# Patient Record
Sex: Male | Born: 1955 | Race: Black or African American | Hispanic: No | Marital: Married | Smoking: Never smoker
Health system: Southern US, Community
[De-identification: ages and names within clinical notes are randomized; demographics above are authoritative.]

---

## 2002-12-26 ENCOUNTER — Emergency Department (HOSPITAL_COMMUNITY): Admission: EM | Admit: 2002-12-26 | Discharge: 2002-12-26 | Payer: Self-pay | Admitting: Emergency Medicine

## 2002-12-26 ENCOUNTER — Encounter: Payer: Self-pay | Admitting: Emergency Medicine

## 2004-10-29 ENCOUNTER — Emergency Department (HOSPITAL_COMMUNITY): Admission: EM | Admit: 2004-10-29 | Discharge: 2004-10-29 | Payer: Self-pay | Admitting: Emergency Medicine

## 2011-12-16 ENCOUNTER — Emergency Department (HOSPITAL_COMMUNITY)
Admission: EM | Admit: 2011-12-16 | Discharge: 2011-12-17 | Disposition: A | Discharge status: Home / Self Care | Attending: Emergency Medicine | Admitting: Emergency Medicine

## 2011-12-16 ENCOUNTER — Encounter (HOSPITAL_COMMUNITY): Payer: Self-pay | Admitting: *Deleted

## 2011-12-16 DIAGNOSIS — Z23 Encounter for immunization: Secondary | ICD-10-CM | POA: Insufficient documentation

## 2011-12-16 DIAGNOSIS — L0390 Cellulitis, unspecified: Secondary | ICD-10-CM

## 2011-12-16 DIAGNOSIS — L02419 Cutaneous abscess of limb, unspecified: Secondary | ICD-10-CM | POA: Insufficient documentation

## 2011-12-16 NOTE — ED Notes (Signed)
Pt reports on 12/08/11 he hit his left medial ankle against the molding on the wall, pt states he sustained lac at that time, pt dressed wound and put hydrogen peroxide on it. Pt here today for eval of site increased swelling and redness to wound.

## 2011-12-17 MED ORDER — CLINDAMYCIN HCL 300 MG PO CAPS: 300.0000 mg | ORAL_CAPSULE | Freq: Three times a day (TID) | ORAL | Status: AC

## 2011-12-17 MED ORDER — TETANUS-DIPHTH-ACELL PERTUSSIS 5-2.5-18.5 LF-MCG/0.5 IM SUSP
0.5000 mL | Freq: Once | INTRAMUSCULAR | Status: AC
  Administered 2011-12-17: 0.5 mL via INTRAMUSCULAR
  Filled 2011-12-17: qty 0.5

## 2011-12-17 MED ORDER — CLINDAMYCIN HCL 300 MG PO CAPS
300.0000 mg | ORAL_CAPSULE | Freq: Once | ORAL | Status: AC
  Administered 2011-12-17: 300 mg via ORAL
  Filled 2011-12-17: qty 1

## 2011-12-17 MED ORDER — ONDANSETRON 4 MG PO TBDP: 4.0000 mg | ORAL_TABLET | Freq: Three times a day (TID) | ORAL | Status: AC | PRN

## 2011-12-17 NOTE — ED Provider Notes (Signed)
History     CSN: [id number]  Arrival date & time 12/16/11  2111   First MD Initiated Contact with Patient 12/17/11 0325      Chief Complaint  Patient presents with  . Wound Check    (Consider location/radiation/quality/duration/timing/severity/associated sxs/prior treatment) HPI  Patient presents to the emergency department requesting evaluation of left leg wound that happened one week ago. The patient states it is starting to become red around it and painful. He has also noticed that it is swollen. He denies the pain being severe. He denies fevers, chills, weakness, pain with walking. He states that he is not a diabetic and normally his wound heal well. He states that he injured his leg by accident when walking into some molding. She is afebrile pulse is 63.  History reviewed. No pertinent past medical history.  History reviewed. No pertinent past surgical history.  No family history on file.  History  Substance Use Topics  . Smoking status: Not on file  . Smokeless tobacco: Not on file  . Alcohol Use: Not on file      Review of Systems   HEENT: denies blurry vision or change in hearing PULMONARY: Denies difficulty breathing and SOB CARDIAC: denies chest pain or heart palpitations MUSCULOSKELETAL:  denies being unable to ambulate ABDOMEN AL: denies abdominal pain GU: denies loss of bowel or urinary control NEURO: denies numbness and tingling in extremities PSYCH: patient behavior is normal NECK: Not complaining of neck pain     Allergies  Review of patient's allergies indicates no known allergies.  Home Medications   Current Outpatient Rx  Name Route Sig Dispense Refill  . IBUPROFEN 200 MG PO TABS Oral Take 200 mg by mouth every 6 (six) hours as needed. Pain    . CLINDAMYCIN HCL 300 MG PO CAPS Oral Take 1 capsule (300 mg total) by mouth 3 (three) times daily. 21 capsule 0  . ONDANSETRON 4 MG PO TBDP Oral Take 1 tablet (4 mg total) by mouth every 8 (eight)  hours as needed for nausea. 20 tablet 0    BP 158/85  Pulse 63  Temp 97.9 F (36.6 C) (Oral)  Resp 16  Ht 5\' 10"  (1.778 m)  Wt 220 lb (99.791 kg)  BMI 31.57 kg/m2  SpO2 100%  Physical Exam  Nursing note and vitals reviewed. Constitutional: He appears well-developed and well-nourished. No distress.  HENT:  Head: Normocephalic and atraumatic.  Eyes: Pupils are equal, round, and reactive to light.  Neck: Normal range of motion. Neck supple.  Cardiovascular: Normal rate and regular rhythm.   Pulmonary/Chest: Effort normal.  Abdominal: Soft.  Neurological: He is alert.  Skin: Skin is warm and dry.          Small  4.5cm in diameter cellulitis surrounding already scabbed over 4 cm laceration to the left shin. No signs of crepitus no pain with passive or active range of motion. No streaking, fluctuance or abscessing.    ED Course  Procedures (including critical care time)  Labs Reviewed - No data to display No results found.   1. Cellulitis       MDM  She was given first dose of clindamycin while in the ED. Afrin a prescription for clindamycin and Zofran. I have requested the patient return in 48 hours for wound recheck.  Pt has been advised of the symptoms that warrant their return to the ED. Patient has voiced understanding and has agreed to follow-up with the PCP or specialist.  Dorthula Matas, PA 12/17/11 [phone]

## 2011-12-17 NOTE — ED Provider Notes (Signed)
Medical screening examination/treatment/procedure(s) were performed by non-physician practitioner and as supervising physician I was immediately available for consultation/collaboration.   Hanley Seamen, MD 12/17/11 [phone]

## 2011-12-17 NOTE — Discharge Instructions (Signed)

## 2015-07-10 ENCOUNTER — Emergency Department (HOSPITAL_COMMUNITY)

## 2015-07-10 ENCOUNTER — Encounter (HOSPITAL_COMMUNITY): Payer: Self-pay

## 2015-07-10 ENCOUNTER — Emergency Department (HOSPITAL_COMMUNITY)
Admission: EM | Admit: 2015-07-10 | Discharge: 2015-07-10 | Disposition: A | Discharge status: Home / Self Care | Attending: Emergency Medicine | Admitting: Emergency Medicine

## 2015-07-10 DIAGNOSIS — S40012A Contusion of left shoulder, initial encounter: Secondary | ICD-10-CM

## 2015-07-10 DIAGNOSIS — Y9289 Other specified places as the place of occurrence of the external cause: Secondary | ICD-10-CM | POA: Diagnosis not present

## 2015-07-10 DIAGNOSIS — S4992XA Unspecified injury of left shoulder and upper arm, initial encounter: Secondary | ICD-10-CM | POA: Diagnosis present

## 2015-07-10 DIAGNOSIS — W000XXA Fall on same level due to ice and snow, initial encounter: Secondary | ICD-10-CM | POA: Insufficient documentation

## 2015-07-10 DIAGNOSIS — Y9389 Activity, other specified: Secondary | ICD-10-CM | POA: Insufficient documentation

## 2015-07-10 DIAGNOSIS — M25512 Pain in left shoulder: Secondary | ICD-10-CM

## 2015-07-10 DIAGNOSIS — W009XXA Unspecified fall due to ice and snow, initial encounter: Secondary | ICD-10-CM

## 2015-07-10 DIAGNOSIS — Y998 Other external cause status: Secondary | ICD-10-CM | POA: Diagnosis not present

## 2015-07-10 MED ORDER — DIAZEPAM 5 MG PO TABS: 5.0000 mg | ORAL_TABLET | Freq: Two times a day (BID) | ORAL | Status: AC | PRN

## 2015-07-10 MED ORDER — OXYCODONE-ACETAMINOPHEN 5-325 MG PO TABS: 1.0000 | ORAL_TABLET | Freq: Four times a day (QID) | ORAL | Status: AC | PRN

## 2015-07-10 MED ORDER — OXYCODONE-ACETAMINOPHEN 5-325 MG PO TABS
1.0000 | ORAL_TABLET | Freq: Once | ORAL | Status: AC
  Administered 2015-07-10: 1 via ORAL
  Filled 2015-07-10: qty 1

## 2015-07-10 MED ORDER — NAPROXEN 500 MG PO TABS
500.0000 mg | ORAL_TABLET | Freq: Once | ORAL | Status: AC
  Administered 2015-07-10: 500 mg via ORAL
  Filled 2015-07-10: qty 1

## 2015-07-10 MED ORDER — NAPROXEN 500 MG PO TABS: 500.0000 mg | ORAL_TABLET | Freq: Two times a day (BID) | ORAL | Status: AC

## 2015-07-10 NOTE — Discharge Instructions (Signed)
Ice areas of pain 3-4 times per day for 15-20 minutes each time. We also recommend that you stretch your shoulder 3-4 times per day to prevent stiffening. Take naproxen as prescribed for pain control and Percocet as needed for severe pain. You may take Valium as prescribed for muscle spasms of your left shoulder. Follow-up with an orthopedist for further evaluation of your injury, especially if pain persists.  Shoulder Pain The shoulder is the joint that connects your arms to your body. The bones that form the shoulder joint include the upper arm bone (humerus), the shoulder blade (scapula), and the collarbone (clavicle). The top of the humerus is shaped like a ball and fits into a rather flat socket on the scapula (glenoid cavity). A combination of muscles and strong, fibrous tissues that connect muscles to bones (tendons) support your shoulder joint and hold the ball in the socket. Small, fluid-filled sacs (bursae) are located in different areas of the joint. They act as cushions between the bones and the overlying soft tissues and help reduce friction between the gliding tendons and the bone as you move your arm. Your shoulder joint allows a wide range of motion in your arm. This range of motion allows you to do things like scratch your back or throw a ball. However, this range of motion also makes your shoulder more prone to pain from overuse and injury. Causes of shoulder pain can originate from both injury and overuse and usually can be grouped in the following four categories:  Redness, swelling, and pain (inflammation) of the tendon (tendinitis) or the bursae (bursitis).  Instability, such as a dislocation of the joint.  Inflammation of the joint (arthritis).  Broken bone (fracture). HOME CARE INSTRUCTIONS   Apply ice to the sore area.  Put ice in a plastic bag.  Place a towel between your skin and the bag.  Leave the ice on for 15-20 minutes, 3-4 times per day for the first 2 days, or  as directed by your health care provider.  Stop using cold packs if they do not help with the pain.  If you have a shoulder sling or immobilizer, wear it as long as your caregiver instructs. Only remove it to shower or bathe. Move your arm as little as possible, but keep your hand moving to prevent swelling.  Squeeze a soft ball or foam pad as much as possible to help prevent swelling.  Only take over-the-counter or prescription medicines for pain, discomfort, or fever as directed by your caregiver. SEEK MEDICAL CARE IF:   Your shoulder pain increases, or new pain develops in your arm, hand, or fingers.  Your hand or fingers become cold and numb.  Your pain is not relieved with medicines. SEEK IMMEDIATE MEDICAL CARE IF:   Your arm, hand, or fingers are numb or tingling.  Your arm, hand, or fingers are significantly swollen or turn white or blue. MAKE SURE YOU:   Understand these instructions.  Will watch your condition.  Will get help right away if you are not doing well or get worse.   This information is not intended to replace advice given to you by your health care provider. Make sure you discuss any questions you have with your health care provider.   Document Released: 03/27/2005 Document Revised: 07/08/2014 Document Reviewed: 10/10/2014 Elsevier Interactive Patient Education [employer]2016 [employer] Inc.

## 2015-07-10 NOTE — ED Notes (Signed)
Pt slipped on ice and fell on his left shoulder, he's unable to lift his shoulder all the way up

## 2015-07-10 NOTE — ED Provider Notes (Signed)
CSN: [id number]     Arrival date & time 07/10/15  2001 History  By signing my name below, I, Raymond Kramer, attest that this documentation has been prepared under the direction and in the presence of non-physician practitioner, Antony Madura, PA-C. Electronically Signed: Freida Kramer, Scribe. 07/10/2015. 9:10 PM.    Chief Complaint  Patient presents with  . Shoulder Injury     The history is provided by the patient. No language interpreter was used.     HPI Comments:  Raymond Kramer is a 60 y.o. male who presents to the Emergency Department complaining of 8/10 sharp left upper arm and shoulder pain s/p  fall ~1730 today. Pt states he slipped on ice and landed on his left upper arm. He notes his pain is worse with movement. No alleviating factors noted. No medications taken PTA. No reported head trauma or LOC. He denies paresthesias and numbness in the extremity. Pt is right hand dominant.   WUJ:[id number] physicians palladium   History reviewed. No pertinent past medical history. History reviewed. No pertinent past surgical history. History reviewed. No pertinent family history. Social History  Substance Use Topics  . Smoking status: Never Smoker   . Smokeless tobacco: None  . Alcohol Use: No    Review of Systems  Constitutional: Negative for fever and chills.  Respiratory: Negative for shortness of breath.   Cardiovascular: Negative for chest pain.  Musculoskeletal: Positive for myalgias and arthralgias.       LUE    Allergies  Review of patient's allergies indicates no known allergies.  Home Medications   Prior to Admission medications   Medication Sig Start Date End Date Taking? Authorizing Provider  diazepam (VALIUM) 5 MG tablet Take 1 tablet (5 mg total) by mouth every 12 (twelve) hours as needed for muscle spasms. 07/10/15   Antony Madura, PA-C  ibuprofen (ADVIL,MOTRIN) 200 MG tablet Take 200 mg by mouth every 6 (six) hours as needed. Pain    Historical Provider, MD   naproxen (NAPROSYN) 500 MG tablet Take 1 tablet (500 mg total) by mouth 2 (two) times daily. 07/10/15   Antony Madura, PA-C  oxyCODONE-acetaminophen (PERCOCET/ROXICET) 5-325 MG tablet Take 1-2 tablets by mouth every 6 (six) hours as needed for severe pain. 07/10/15   Antony Madura, PA-C   BP 165/92 mmHg  Pulse 98  Temp(Src) 98.4 F (36.9 C) (Oral)  Resp 16  Ht 5\' 10"  (1.778 m)  Wt 91.173 kg  BMI 28.84 kg/m2  SpO2 100%   Physical Exam  Constitutional: He is oriented to person, place, and time. He appears well-developed and well-nourished. No distress.  Nontoxic/nonseptic appearing  HENT:  Head: Normocephalic and atraumatic.  Eyes: Conjunctivae and EOM are normal. No scleral icterus.  Neck: Normal range of motion.  Cardiovascular: Normal rate, regular rhythm and intact distal pulses.   Distal radial pulse 2+ in the LUE  Pulmonary/Chest: Effort normal. No respiratory distress. He has no wheezes.  Respirations even and unlabored.  Musculoskeletal:       Left shoulder: He exhibits decreased range of motion, tenderness and pain. He exhibits no swelling, no crepitus, no deformity, no spasm and normal strength.       Cervical back: Normal.       Left upper arm: Normal.  Preserved AROM of L shoulder with abduction; limited flexion of L shoulder due to pain. No bony deformity or crepitus. No effusion, erythema, or heat to touch. Negative sulcus sign.  Neurological: He is alert and oriented to person,  place, and time. He exhibits normal muscle tone. Coordination normal.  Grip strength 5/5 in the LUE. Strength in the LUE intact.   Skin: Skin is warm and dry. No rash noted. He is not diaphoretic. No erythema. No pallor.  Psychiatric: He has a normal mood and affect. His behavior is normal.  Nursing note and vitals reviewed.   ED Course  Procedures   DIAGNOSTIC STUDIES:  Oxygen Saturation is 95% on RA, normal by my interpretation.    COORDINATION OF CARE:  9:09 PM Advised pt to exercise the  shoulder as much as possible. Discussed treatment plan with pt at bedside and pt agreed to plan.  Imaging Review Dg Shoulder Left  07/10/2015  CLINICAL DATA:  Pain following fall earlier today EXAM: LEFT SHOULDER - 2+ VIEW COMPARISON:  None. FINDINGS: Oblique, Y scapular, and axillary images were obtained. There is cortical irregularity along the lateral humeral head with what appears to be a Hill-Sachs type defect of uncertain age. No other evidence suggesting fracture. No dislocation. 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 No erosive change. IMPRESSION: Cortical irregularity along the lateral humeral head, best seen on the axillary view. This appearance is consistent with an age uncertain Hill-Sachs defect. The appearance in this area raises concern for recent defect in this area. No other findings suggesting fracture. Currently no dislocation. Question with the patient had anterior dislocation with spontaneous reduction at the time of injury. Potentially CT with particular attention to the lateral humeral head could be helpful to further evaluate. 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 Electronically Signed   By: Bretta BangWilliam  Woodruff III M.D.   On: 07/10/2015 21:12   I have personally reviewed and evaluated these images as part of my medical decision-making.   MDM   Final diagnoses:  Contusion of left shoulder, initial encounter  Left shoulder pain  Fall from slipping on ice, initial encounter    60 year old male presents to the emergency department for evaluation of left shoulder pain after a slip and fall on ice. Patient denies head trauma or loss of consciousness. He is neurovascularly intact. Tenderness noted to the left shoulder without bony deformity or crepitus. X-ray obtained which shows a cortical irregularity without evidence of fracture or dislocation. Story not consistent with dislocation and spontaneous reduction. No concern for septic  joint. Patient given shoulder sling. Will refer to orthopedics. Have advised icing, stretching, and NSAIDs. Patient also given Percocet and Valium for additional pain control. Return precautions discussed and provided. Patient discharged in good condition with no unaddressed concerns.  I personally performed the services described in this documentation, which was scribed in my presence. The recorded information has been reviewed and is accurate.    Filed Vitals:   07/10/15 2026 07/10/15 2156  BP: 166/107 165/92  Pulse: 72 98  Temp: 98.4 F (36.9 C)   TempSrc: Oral   Resp: 20 16  Height: 5\' 10"  (1.778 m)   Weight: 91.173 kg   SpO2: 95% 100%      Antony MaduraKelly Lorna Strother, PA-C 07/10/15 2202  Antony MaduraKelly Tiwan Schnitker, PA-C 07/10/15 2203  Loren Raceravid Yelverton, MD 07/11/15 [phone]

## 2016-04-08 IMAGING — CR DG SHOULDER 2+V*L*
3 series · 3 of 3 positions shown · non-contrast
Comparison: None.

CLINICAL DATA: Pain following fall earlier today

EXAM:
LEFT SHOULDER - 2+ VIEW

[w shoulder external left]
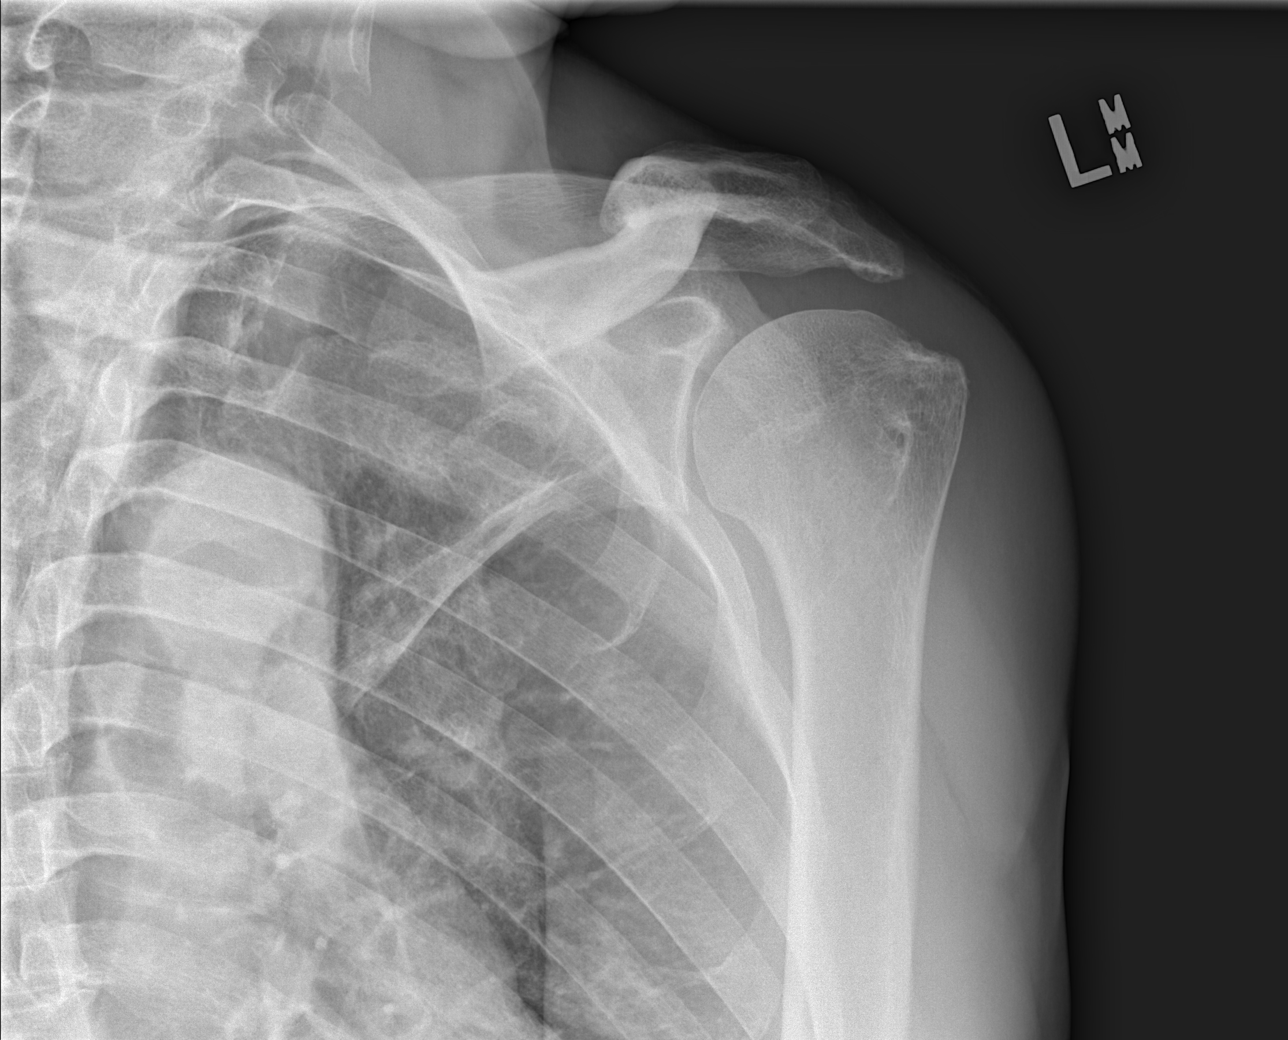

[w shoulder y-view left]
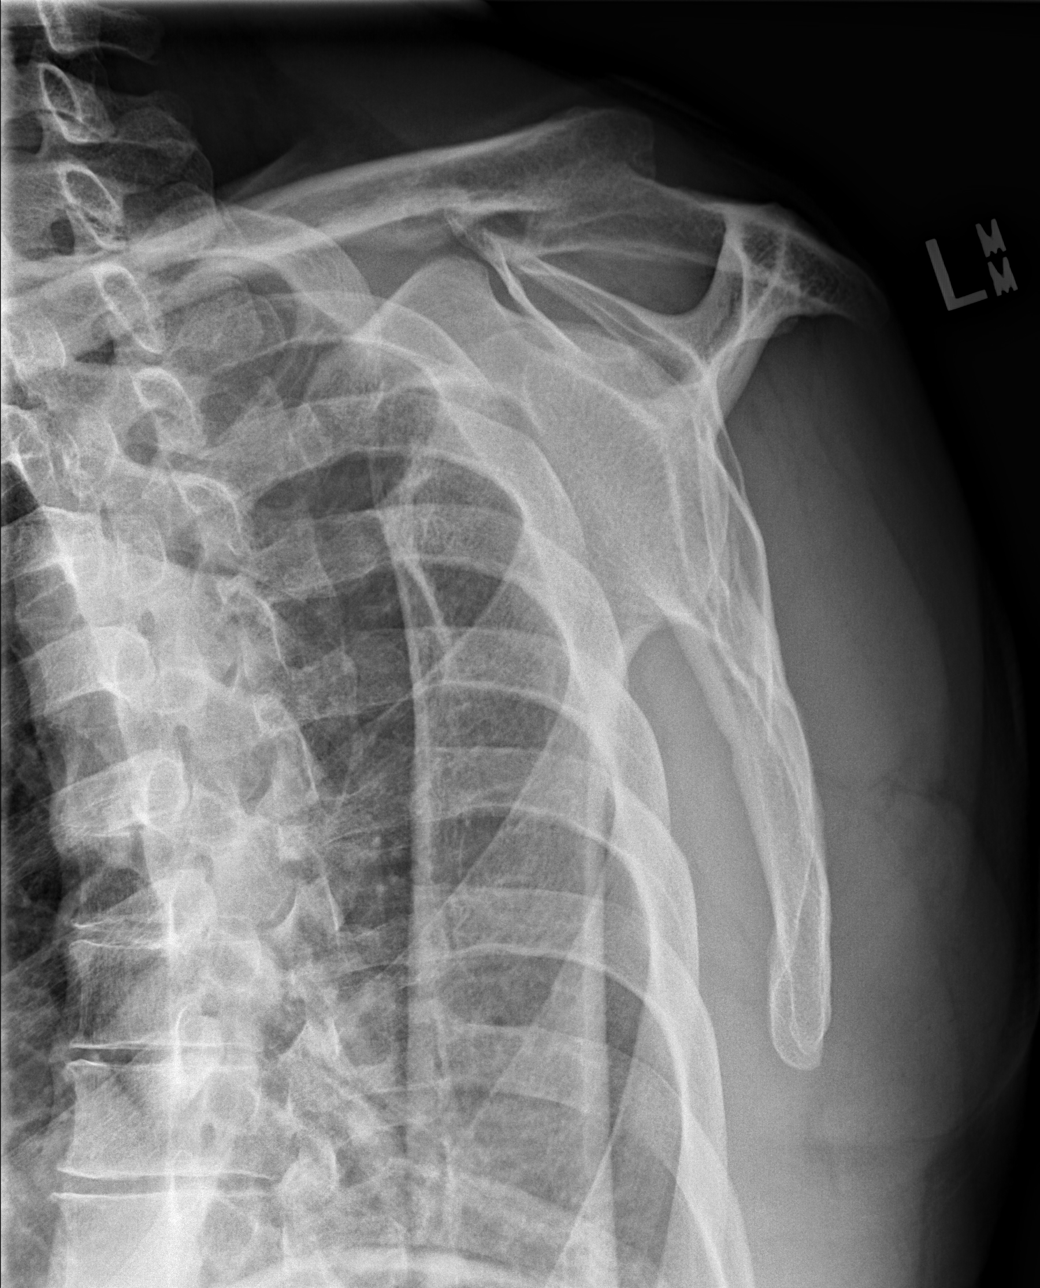

[x shoulder axillary left]
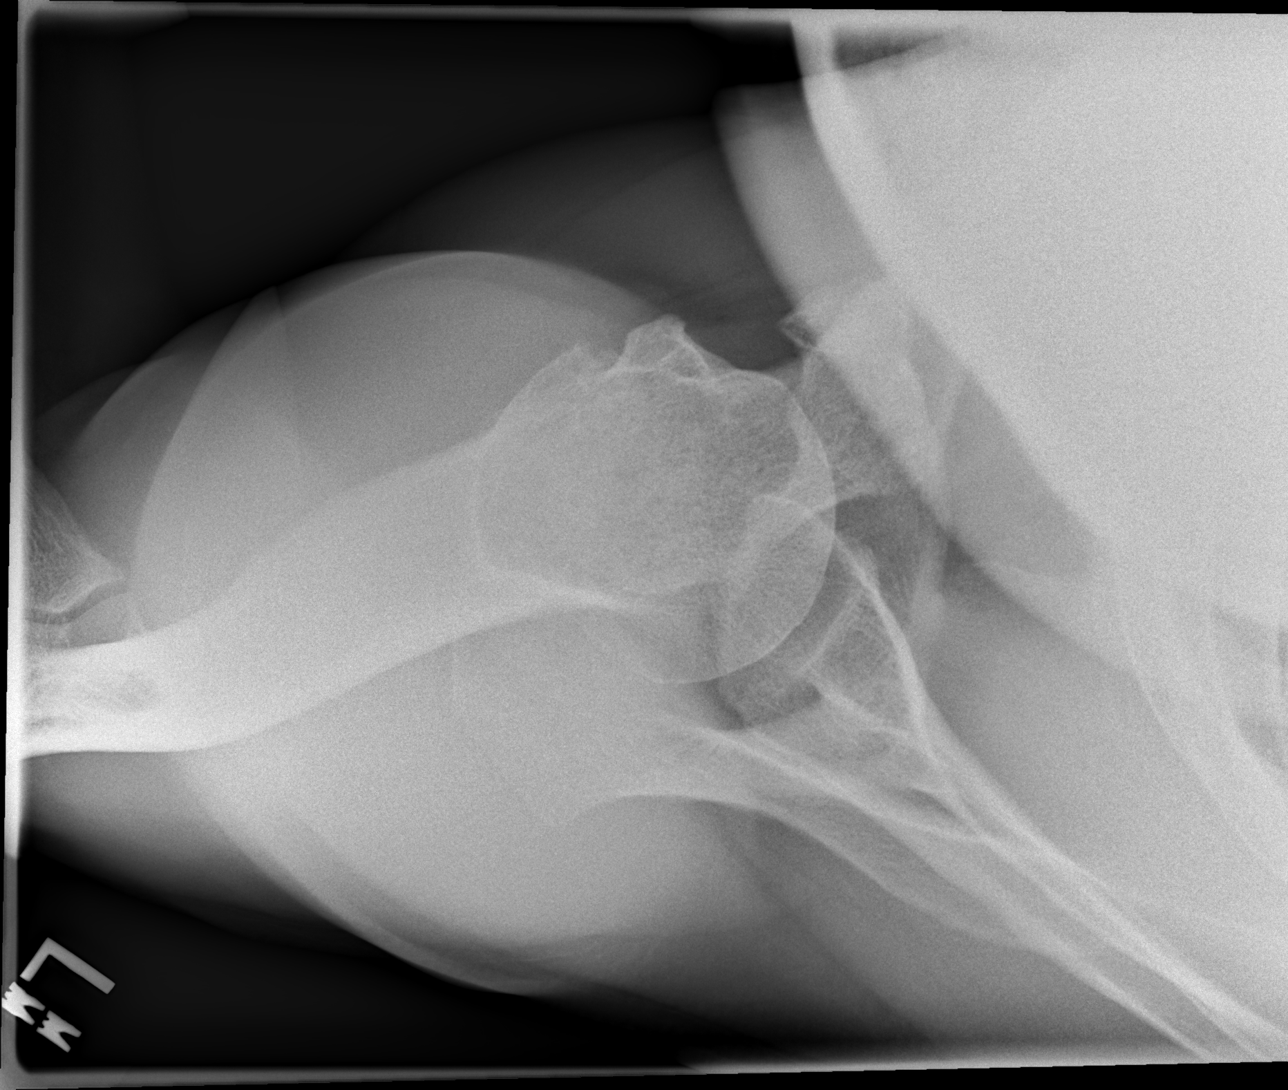

[3 of 3 positions shown; findings below may reference images not displayed]

FINDINGS: Oblique, Y scapular, and axillary images were obtained. There is
cortical irregularity along the lateral humeral head with what
appears to be a Hill-Sachs type defect of uncertain age. No other
evidence suggesting fracture. No dislocation. There is
osteoarthritic change in the acromioclavicular joint. No erosive
change.
IMPRESSION: Cortical irregularity along the lateral humeral head, best seen on
the axillary view. This appearance is consistent with an age
uncertain Hill-Sachs defect. The appearance in this area raises
concern for recent defect in this area. No other findings suggesting
fracture. Currently no dislocation. Question with the patient had
anterior dislocation with spontaneous reduction at the time of
injury. Potentially CT with particular attention to the lateral
humeral head could be helpful to further evaluate. There is
osteoarthritic change in the acromioclavicular joint.

## 2018-05-30 ENCOUNTER — Other Ambulatory Visit: Payer: Self-pay

## 2018-05-30 ENCOUNTER — Emergency Department (HOSPITAL_COMMUNITY)
Admission: EM | Admit: 2018-05-30 | Discharge: 2018-05-30 | Disposition: A | Discharge status: Home / Self Care | Attending: Emergency Medicine | Admitting: Emergency Medicine

## 2018-05-30 DIAGNOSIS — Z79899 Other long term (current) drug therapy: Secondary | ICD-10-CM | POA: Insufficient documentation

## 2018-05-30 DIAGNOSIS — L03113 Cellulitis of right upper limb: Secondary | ICD-10-CM | POA: Diagnosis present

## 2018-05-30 DIAGNOSIS — L0291 Cutaneous abscess, unspecified: Secondary | ICD-10-CM

## 2018-05-30 MED ORDER — LIDOCAINE HCL (PF) 1 % IJ SOLN
10.0000 mL | Freq: Once | INTRAMUSCULAR | Status: AC
  Administered 2018-05-30: 10 mL via INTRADERMAL
  Filled 2018-05-30: qty 10

## 2018-05-30 MED ORDER — CEPHALEXIN 500 MG PO CAPS: 500.0000 mg | ORAL_CAPSULE | Freq: Four times a day (QID) | ORAL | 0 refills | Status: AC

## 2018-05-30 NOTE — ED Triage Notes (Signed)
Significant swelling noted to right arm, obvious insect bite and abscess noted. Swelling radiates throughout the arm. Pt states he thinks a spider bit him.

## 2018-05-30 NOTE — ED Provider Notes (Signed)
[facility]   CSN: [id number] Arrival date & time: 05/30/18  [id number]     History   Chief Complaint Chief Complaint  Patient presents with  . Arm Swelling    right  . Insect Bite    HPI Raymond Kramer is a 62 y.o. male who presents for evaluation of RUE pain and swelling that has been ongoing for the last 2 days. He states that he thinks an insect may have bitten him but he does not recall the specific event. He noticed a small raised bump that appeared with swelling around it. No overlying warmth or erythema. He denies trauma, injury to the arm. He has not noted any fevers, numbness/weakness.   The history is provided by the patient.    No past medical history on file.  There are no active problems to display for this patient.   No past surgical history on file.      Home Medications    Prior to Admission medications   Medication Sig Start Date End Date Taking? Authorizing Provider  cephALEXin (KEFLEX) 500 MG capsule Take 1 capsule (500 mg total) by mouth 4 (four) times daily for 7 days. 05/30/18 06/06/18  Maxwell CaulLayden, Lindsey A, PA-C  diazepam (VALIUM) 5 MG tablet Take 1 tablet (5 mg total) by mouth every 12 (twelve) hours as needed for muscle spasms. 07/10/15   Antony MaduraHumes, Kelly, PA-C  ibuprofen (ADVIL,MOTRIN) 200 MG tablet Take 200 mg by mouth every 6 (six) hours as needed. Pain    [provider]  naproxen (NAPROSYN) 500 MG tablet Take 1 tablet (500 mg total) by mouth 2 (two) times daily. 07/10/15   Antony MaduraHumes, Kelly, PA-C  oxyCODONE-acetaminophen (PERCOCET/ROXICET) 5-325 MG tablet Take 1-2 tablets by mouth every 6 (six) hours as needed for severe pain. 07/10/15   Antony MaduraHumes, Kelly, PA-C    Family History No family history on file.  Social History Social History   Tobacco Use  . Smoking status: Never Smoker  Substance Use Topics  . Alcohol use: No  . Drug use: Not on file     Allergies   Patient has no known  allergies.   Review of Systems Review of Systems  Constitutional: Negative for fever.  Skin: Positive for wound. Negative for color change.  Neurological: Negative for weakness and numbness.  All other systems reviewed and are negative.    Physical Exam Updated Vital Signs BP (!) 172/122 (BP Location: Left Arm)   Pulse 74   Temp 97.7 F (36.5 C) (Oral)   Resp 16   SpO2 100%   Physical Exam  Constitutional: He appears well-developed and well-nourished.  HENT:  Head: Normocephalic and atraumatic.  Eyes: Conjunctivae and EOM are normal. Right eye exhibits no discharge. Left eye exhibits no discharge. No scleral icterus.  Cardiovascular:  Pulses:      Radial pulses are 2+ on the right side, and 2+ on the left side.  Pulmonary/Chest: Effort normal.  Musculoskeletal:  No bony tenderness to the right elbow. No overlying warmth, erythema. Full flexion/extension of right elbow intact without any difficulty.  The proximal forearm is slightly indurated and swollen.  No overlying warmth, erythema.  No tenderness noted to palpation.  Neurological: He is alert.  Sensation intact along major nerve distributions of BUE  Skin: Skin is warm and dry. Capillary refill takes less than 2 seconds.     Good distal cap refill.  RUE is not dusky in appearance or cool to touch.  Psychiatric: He has a normal mood and affect. His speech is normal and behavior is normal.  Nursing note and vitals reviewed.    ED Treatments / Results  Labs (all labs ordered are listed, but only abnormal results are displayed) Labs Reviewed - No data to display  EKG None  Radiology No results found.  Procedures .Marland KitchenIncision and Drainage Date/Time: 05/30/2018 10:13 AM Performed by: Maxwell Caul, PA-C Authorized by: Maxwell Caul, PA-C   Consent:    Consent obtained:  Verbal   Consent given by:  Patient   Risks discussed:  Bleeding, incomplete drainage, pain and damage to other organs    Alternatives discussed:  No treatment Universal protocol:    Procedure explained and questions answered to patient or proxy's satisfaction: yes     Relevant documents present and verified: yes     Test results available and properly labeled: yes     Imaging studies available: yes     Required blood products, implants, devices, and special equipment available: yes     Site/side marked: yes     Immediately prior to procedure a time out was called: yes     Patient identity confirmed:  Verbally with patient Location:    Type:  Abscess   Location:  Upper extremity   Upper extremity location:  Arm   Arm location:  R lower arm Pre-procedure details:    Skin preparation:  Betadine Anesthesia (see MAR for exact dosages):    Anesthesia method:  Local infiltration   Local anesthetic:  Lidocaine 1% w/o epi Procedure type:    Complexity:  Complex Procedure details:    Incision types:  Single straight   Incision depth:  Subcutaneous   Scalpel blade:  11   Wound management:  Probed and deloculated, irrigated with saline and extensive cleaning   Drainage:  Purulent   Drainage amount:  Moderate   Packing materials:  None Post-procedure details:    Patient tolerance of procedure:  Tolerated well, no immediate complications Comments:     Once the area was anesthetized, a scalpel was used to make incision which revealed moderate amount of purulent drainage.  The wound was thoroughly and extensively explored with hemostats.  No evidence of tracking, or deep abscess.  The abscess appeared very superficial.  It did not feel like it extended into the muscle.  It was thoroughly and extensively irrigated with sterile saline.   (including critical care time)  Medications Ordered in ED Medications  lidocaine (PF) (XYLOCAINE) 1 % injection 10 mL (10 mLs Intradermal Given 05/30/18 0948)     Initial Impression / Assessment and Plan / ED Course  I have reviewed the triage vital signs and the nursing  notes.  Pertinent labs & imaging results that were available during my care of the patient were reviewed by me and considered in my medical decision making (see chart for details).     62 year old male who presents for evaluation of right upper extremity swelling has been ongoing for last several days.  No overlying warmth or erythema.  No fevers.  He noted a small raised area.  Does not know if he was bitten by any insect.  Came in today for worsening pain. Patient is afebrile, non-toxic appearing, sitting comfortably on examination table. Vital signs reviewed and stable.  Patient is slightly hypertensive.  He reports no history of high blood pressure but also states he has not had a primary care doctor evaluate him in several years.  On exam,  he has a 4 x 4 centimeter area of induration with central fluctuance noted to the posterior right forearm just distal to the elbow.  No overlying warmth, erythema.  Edema does not extend up into the elbow or cross the joint line.  Consider abscess with surrounding cellulitis.  History/physical exam is not concerning for septic arthritis, infected olecranon bursitis, DVT, acute arterial embolism.  Plan for I&D here in the department.  I&D as documented above.  Patient tolerated procedure well.  Given extensive induration, will plan for antibiotic therapy.  Patient with no known drug allergies.  Discussed with patient regarding his blood pressure and that he should have it rechecked in 1 week by primary care doctor.  Patient given outpatient referral to [facility] wellness clinic. Patient had ample opportunity for questions and discussion. All patient's questions were answered with full understanding. Strict return precautions discussed. Patient expresses understanding and agreement to plan.   Final Clinical Impressions(s) / ED Diagnoses   Final diagnoses:  Abscess  Cellulitis of right upper extremity    ED Discharge Orders         Ordered    cephALEXin (KEFLEX)  500 MG capsule  4 times daily     05/30/18 1008           Rosana Hoes 05/30/18 1356    Benjiman Core, MD 05/30/18 1605

## 2018-05-30 NOTE — Discharge Instructions (Addendum)
Your blood pressure today was elevated. Please have this rechecked by a primary care doctor in 1 week.   Apply warm compresses to the area or soak the area in warm water to help continue express drainage.   Keep the wound clean and dry. Gently wash the wound with soap and water and make sure to pat it dry.    Take antibiotic and complete the entire course.   You can take Tylenol or Ibuprofen as directed for pain.  Followup with the Emergency Dept, Urgent Care or a primary care doctor  in 2 days for wound recheck.   Return to the Emergency Department if you experienced any worsening/spreading redness or swelling, fever, worsening pain, or any other worsening or concerning symptoms.

## 2018-06-02 ENCOUNTER — Emergency Department (HOSPITAL_COMMUNITY)
Admission: EM | Admit: 2018-06-02 | Discharge: 2018-06-02 | Disposition: A | Discharge status: Home / Self Care | Attending: Emergency Medicine | Admitting: Emergency Medicine

## 2018-06-02 ENCOUNTER — Encounter (HOSPITAL_COMMUNITY): Payer: Self-pay | Admitting: Emergency Medicine

## 2018-06-02 DIAGNOSIS — Z5189 Encounter for other specified aftercare: Secondary | ICD-10-CM

## 2018-06-02 DIAGNOSIS — L02413 Cutaneous abscess of right upper limb: Secondary | ICD-10-CM | POA: Diagnosis present

## 2018-06-02 NOTE — Discharge Instructions (Addendum)
You were evaluated in the Emergency Department and after careful evaluation, we did not find any emergent condition requiring admission or further testing in the hospital.  Your wound appears to be well-healing today.  Warm compresses at home with a moist rag or paper towel will help with the wound healing continue to drain.  Please complete your course of antibiotics.  Please return to the Emergency Department if you experience any worsening of your condition.  We encourage you to follow up with a primary care provider.  Thank you for allowing us to be a part of your care.

## 2018-06-02 NOTE — ED Triage Notes (Signed)
Pt states he was bitten by a spider on his right elbow and was sent home with antibiotics. Pt is here for follow up to have site assessed. No redness noted. Site appears to be healing

## 2018-06-02 NOTE — ED Provider Notes (Signed)
[facility] Emergency Department Provider Note MRN:  [id number]  Arrival date & time: 06/02/18     Chief Complaint   Follow-up   History of Present Illness   Raymond Kramer is a 62 y.o. year-old male with no pertinent past medical history presenting to the ED with chief complaint of follow-up.  Patient developed an abscess to his right forearm several days ago, was seen here in the emergency department and treated with an incision and drainage.  Has been taking his antibiotics as directed.  Here for a follow-up visit to make sure his wound is healing well.  Denies fevers or chills, no other symptoms.  The pain is much improved near the abscess, the wound is still actively draining.  Review of Systems  A problem-focused ROS was performed. Positive for wound.  Patient denies fever.  Patient's Health History   History reviewed. No pertinent past medical history.  History reviewed. No pertinent surgical history.  History reviewed. No pertinent family history.  Social History   Socioeconomic History  . Marital status: Married    Spouse name: Not on file  . Number of children: Not on file  . Years of education: Not on file  . Highest education level: Not on file  Occupational History  . Not on file  Social Needs  . Financial resource strain: Not on file  . Food insecurity:    Worry: Not on file    Inability: Not on file  . Transportation needs:    Medical: Not on file    Non-medical: Not on file  Tobacco Use  . Smoking status: Never Smoker  . Smokeless tobacco: Never Used  Substance and Sexual Activity  . Alcohol use: No  . Drug use: Not on file  . Sexual activity: Not on file  Lifestyle  . Physical activity:    Days per week: Not on file    Minutes per session: Not on file  . Stress: Not on file  Relationships  . Social connections:    Talks on phone: Not on file    Gets together: Not on file    Attends religious service: Not on file    Active  member of club or organization: Not on file    Attends meetings of clubs or organizations: Not on file    Relationship status: Not on file  . Intimate partner violence:    Fear of current or ex partner: Not on file    Emotionally abused: Not on file    Physically abused: Not on file    Forced sexual activity: Not on file  Other Topics Concern  . Not on file  Social History Narrative  . Not on file     Physical Exam  Vital Signs and Nursing Notes reviewed Vitals:   06/02/18 1107  BP: (!) 176/101  Pulse: 68  Resp: 18  Temp: 97.9 F (36.6 C)  SpO2: 100%    CONSTITUTIONAL: Well-appearing, NAD NEURO:  Alert and oriented x 3, no focal deficits EYES:  eyes equal and reactive ENT/NECK:  no LAD, no JVD CARDIO: Regular rate, well-perfused, normal S1 and S2 PULM:  CTAB no wheezing or rhonchi GI/GU:  normal bowel sounds, non-distended, non-tender MSK/SPINE:  No gross deformities, no edema SKIN:  no rash; well-healing open, actively draining wound to the posterior proximal right forearm PSYCH:  Appropriate speech and behavior  Diagnostic and Interventional Summary    EKG Interpretation  Date/Time:    Ventricular Rate:  PR Interval:    QRS Duration:   QT Interval:    QTC Calculation:   R Axis:     Text Interpretation:        Labs Reviewed - No data to display  No orders to display    Medications - No data to display   Procedures Critical Care  ED Course and Medical Decision Making  I have reviewed the triage vital signs and the nursing notes.  Pertinent labs & imaging results that were available during my care of the patient were reviewed by me and considered in my medical decision making (see below for details).  Well-healing abscess after incision and drainage a few days ago, patient still has several doses of antibiotics remaining.  No systemic symptoms.  Still actively draining, no indication for repeated I&D.  Advise warm compresses at home, continue  antibiotics.  After the discussed management above, the patient was determined to be safe for discharge.  The patient was in agreement with this plan and all questions regarding their care were answered.  ED return precautions were discussed and the patient will return to the ED with any significant worsening of condition.   Elmer Sow. Pilar Plate, MD [facility] Emergency Medicine [facility] [email or web address]  Final Clinical Impressions(s) / ED Diagnoses     ICD-10-CM   1. Wound check, abscess Z51.89     ED Discharge Orders    None         Sabas Sous, MD 06/02/18 1124

## 2021-04-19 ENCOUNTER — Emergency Department (HOSPITAL_COMMUNITY)
Admission: EM | Admit: 2021-04-19 | Discharge: 2021-04-19 | Disposition: A | Discharge status: Home / Self Care | Payer: Medicare Other | Attending: Emergency Medicine | Admitting: Emergency Medicine

## 2021-04-19 ENCOUNTER — Encounter (HOSPITAL_COMMUNITY): Payer: Self-pay | Admitting: Emergency Medicine

## 2021-04-19 DIAGNOSIS — I10 Essential (primary) hypertension: Secondary | ICD-10-CM | POA: Diagnosis not present

## 2021-04-19 DIAGNOSIS — Z9114 Patient's other noncompliance with medication regimen: Secondary | ICD-10-CM | POA: Diagnosis not present

## 2021-04-19 DIAGNOSIS — M7989 Other specified soft tissue disorders: Secondary | ICD-10-CM

## 2021-04-19 DIAGNOSIS — R2241 Localized swelling, mass and lump, right lower limb: Secondary | ICD-10-CM | POA: Insufficient documentation

## 2021-04-19 MED ORDER — CEPHALEXIN 500 MG PO CAPS
500.0000 mg | ORAL_CAPSULE | Freq: Once | ORAL | Status: AC
  Administered 2021-04-19: 500 mg via ORAL
  Filled 2021-04-19: qty 1

## 2021-04-19 MED ORDER — RIVAROXABAN 15 MG PO TABS
15.0000 mg | ORAL_TABLET | Freq: Once | ORAL | Status: AC
  Administered 2021-04-19: 15 mg via ORAL
  Filled 2021-04-19: qty 1

## 2021-04-19 MED ORDER — CEPHALEXIN 500 MG PO CAPS: 500.0000 mg | ORAL_CAPSULE | Freq: Four times a day (QID) | ORAL | 0 refills | Status: AC

## 2021-04-19 MED ORDER — CEPHALEXIN 500 MG PO CAPS: 500.0000 mg | ORAL_CAPSULE | Freq: Four times a day (QID) | ORAL | 0 refills | Status: DC

## 2021-04-19 NOTE — ED Provider Notes (Signed)
[facility]-EMERGENCY DEPT Provider Note   CSN: [id number] Arrival date & time: 04/19/21  1906     History Chief Complaint  Patient presents with   Leg Swelling    Raymond Kramer is a 65 y.o. male with reported history of hypertension.  Patient presents emergency department with a chief complaint of right lower leg swelling.  Patient reports that swelling started yesterday and has remained constant since then.  Patient complains of tenderness to palpation.  Patient denies any pain at rest.  Patient endorses warmth to right lower extremity.  Patient has not tried any modalities to alleviate his swelling.  Patient reports that he flew from [place] back to [place] on Sunday and Tuesday.  Patient endorses standing very little during the first night and none during his flight home on Tuesday.  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  Denies any history of DVT or PE.  Denies any numbness, weakness, chest pain, shortness of breath, color change, wound.   HPI     History reviewed. No pertinent past medical history.  There are no problems to display for this patient.   History reviewed. No pertinent surgical history.     No family history on file.  Social History   Tobacco Use   Smoking status: Never   Smokeless tobacco: Never  Substance Use Topics   Alcohol use: No    Home Medications Prior to Admission medications   Medication Sig Start Date End Date Taking? Authorizing Provider  diazepam (VALIUM) 5 MG tablet Take 1 tablet (5 mg total) by mouth every 12 (twelve) hours as needed for muscle spasms. 07/10/15   Antony Madura, PA-C  ibuprofen (ADVIL,MOTRIN) 200 MG tablet Take 200 mg by mouth every 6 (six) hours as needed. Pain    [provider]  naproxen (NAPROSYN) 500 MG tablet Take 1 tablet (500 mg total) by mouth 2 (two) times daily. 07/10/15   Antony Madura, PA-C  oxyCODONE-acetaminophen  (PERCOCET/ROXICET) 5-325 MG tablet Take 1-2 tablets by mouth every 6 (six) hours as needed for severe pain. 07/10/15   Antony Madura, PA-C    Allergies    Patient has no known allergies.  Review of Systems   Review of Systems  Constitutional:  Negative for chills and fever.  Eyes:  Negative for visual disturbance.  Respiratory:  Negative for shortness of breath.   Cardiovascular:  Positive for leg swelling. Negative for chest pain and palpitations.  Gastrointestinal:  Negative for abdominal pain, nausea and vomiting.  Musculoskeletal:  Negative for back pain and neck pain.  Skin:  Negative for color change and rash.  Neurological:  Negative for dizziness, syncope, light-headedness and headaches.  Hematological:  Does not bruise/bleed easily.  Psychiatric/Behavioral:  Negative for confusion.    Physical Exam Updated Vital Signs BP (!) 193/111 (BP Location: Right Arm)   Pulse 72   Temp 98 F (36.7 C) (Oral)   Resp 18   SpO2 98%   Physical Exam Vitals and nursing note reviewed.  Constitutional:      General: He is not in acute distress.    Appearance: He is not ill-appearing, toxic-appearing or diaphoretic.  HENT:     Head: Normocephalic.  Eyes:     General: No scleral icterus.       Right eye: No discharge.        Left eye: No discharge.  Cardiovascular:     Rate and Rhythm: Normal rate.  Pulses:          Dorsalis pedis pulses are 2+ on the right side and 2+ on the left side.  Pulmonary:     Effort: Pulmonary effort is normal. No tachypnea, bradypnea or respiratory distress.     Breath sounds: Normal breath sounds. No stridor.  Musculoskeletal:     Right lower leg: Swelling and tenderness present. No deformity, lacerations or bony tenderness.     Left lower leg: No swelling, deformity, lacerations, tenderness or bony tenderness. No edema.     Right ankle: No swelling, deformity, ecchymosis or lacerations. No tenderness. Normal range of motion.     Left ankle: No  swelling, deformity, ecchymosis or lacerations. No tenderness. Normal range of motion.     Right foot: Normal range of motion and normal capillary refill. No swelling, deformity, laceration, tenderness, bony tenderness or crepitus. Normal pulse.     Left foot: Normal range of motion and normal capillary refill. No swelling, laceration, tenderness, bony tenderness or crepitus. Normal pulse.     Comments: Has swelling and warmth to right lower extremity.  Tenderness to right calf.  Skin:    General: Skin is warm and dry.  Neurological:     General: No focal deficit present.     Mental Status: He is alert.  Psychiatric:        Behavior: Behavior is cooperative.    ED Results / Procedures / Treatments   Labs (all labs ordered are listed, but only abnormal results are displayed) Labs Reviewed - No data to display  EKG None  Radiology No results found.  Procedures Procedures   Medications Ordered in ED Medications - No data to display  ED Course  I have reviewed the triage vital signs and the nursing notes.  Pertinent labs & imaging results that were available during my care of the patient were reviewed by me and considered in my medical decision making (see chart for details).    MDM Rules/Calculators/A&P                           Alert 65 year old male no acute stress, nontoxic-appearing.  Presents emergency department with chief complaint of swelling and tenderness to right lower extremity.  Symptoms started after flying to [place] and back.  No history of DVT or PE.  Patient has no complaints of chest pain or shortness of breath.  Patient noted to have swelling and warmth to right lower extremity.  Concern for DVT versus cellulitis.  Unable to obtain ultrasound imaging to assess for DVT at this time.  We will start patient empirically on Xarelto and have him return tomorrow for DVT imaging.  Due to concern for possible cellulitis we will also start patient on  Keflex.  Patient was noted to be hypertensive.  Patient reports history of hypertension however noncompliant with medication.  Patient reports that he was prescribed hypertension medication by his primary care provider earlier today.  Patient has picked up this medication and plans to take it.  Patient denies any headache, visual disturbance, chest pain, shortness of breath.  Hypertension is asymptomatic at this time.  We will have patient follow-up with primary care provider for further hypertension management.  Patient care was discussed with attending physician Dr. Fredderick Phenix.  Discussed results, findings, treatment and follow up. Patient advised of return precautions. Patient verbalized understanding and agreed with plan.   Final Clinical Impression(s) / ED Diagnoses Final diagnoses:  Leg swelling  Hypertension, unspecified type    Rx / DC Orders ED Discharge Orders          Ordered    cephALEXin (KEFLEX) 500 MG capsule  4 times daily,   Status:  Discontinued        04/19/21 2043    LE VENOUS        04/19/21 2046    cephALEXin (KEFLEX) 500 MG capsule  4 times daily        04/19/21 2057             Berneice Heinrich 04/19/21 2134    Rolan Bucco, MD 04/19/21 2202

## 2021-04-19 NOTE — ED Triage Notes (Signed)
Pt reports RLE swelling since yesterday. Pt was recently on a long flight and is concerned for DVT. Hx of HTN. Denies hx of blood clot.

## 2021-04-19 NOTE — Discharge Instructions (Addendum)
You came to the emergency department today to be evaluated for your leg swelling.  Your physical exam was concerning for possible blood clot in your leg.  Unfortunately we are unable to obtain ultrasound imaging to evaluate for blood clot in your leg, you will need to return to [facility] tomorrow to have this imaging done.  Due to this you were given a blood thinner to treat for a possible blood clot.  Please read attached paperwork on medication Xarelto.  Due to concern for possible skin infection he was started on the antibiotic Keflex.  Please take this medication as prescribed.  You may have diarrhea from the antibiotics.  It is very important that you continue to take the antibiotics even if you get diarrhea unless a medical professional tells you that you may stop taking them.  If you stop too early the bacteria you are being treated for will become stronger and you may need different, more powerful antibiotics that have more side effects and worsening diarrhea.  Please stay well hydrated and consider probiotics as they may decrease the severity of your diarrhea.   Your blood pressure was elevated in the emergency department.  Please take your prescribed high blood pressure medication.  Please follow-up with your primary care provider for further management of your hypertension.  Get help right away if: You have signs or symptoms that a blood clot has moved to the lungs. These may include: Shortness of breath. Chest pain. Fast or irregular heartbeats (palpitations). Light-headedness or dizziness. Coughing up blood. You have signs or symptoms that your blood is too thin. These may include: Blood in your vomit, stool, or urine. A cut that will not stop bleeding. A menstrual period that is heavier than usual. A severe headache or confusion. These symptoms may represent a serious problem that is an emergency. Do not wait to see if the symptoms will go away. Get medical help right away. Call your  local emergency services (911 in the [place] Do not drive yourself to the hospital.

## 2021-04-20 ENCOUNTER — Ambulatory Visit (HOSPITAL_COMMUNITY): Payer: Medicare Other | Attending: Emergency Medicine
